# Patient Record
Sex: Male | Born: 2000 | Race: Black or African American | Hispanic: No | Marital: Single | Smoking: Never smoker
Health system: Southern US, Community
[De-identification: ages and names within clinical notes are randomized; demographics above are authoritative.]

---

## 2018-09-30 ENCOUNTER — Ambulatory Visit (HOSPITAL_COMMUNITY)
Admission: EM | Admit: 2018-09-30 | Discharge: 2018-09-30 | Disposition: A | Discharge status: Home / Self Care | Payer: 59 | Attending: Internal Medicine | Admitting: Internal Medicine

## 2018-09-30 ENCOUNTER — Encounter (HOSPITAL_COMMUNITY): Payer: Self-pay

## 2018-09-30 ENCOUNTER — Other Ambulatory Visit: Payer: Self-pay

## 2018-09-30 DIAGNOSIS — T148XXA Other injury of unspecified body region, initial encounter: Secondary | ICD-10-CM

## 2018-09-30 MED ORDER — NAPROXEN 375 MG PO TABS: 375.0000 mg | ORAL_TABLET | Freq: Two times a day (BID) | ORAL | 0 refills | Status: DC

## 2018-09-30 NOTE — ED Provider Notes (Signed)
[place]    CSN: [id number] Arrival date & time: 09/30/18  1152     History   Chief Complaint Chief Complaint  Patient presents with  . Leg Pain    HPI Jonathan Haynes is a 18 y.o. male no past medical history comes to urgent care with complaints of left thigh pain which started yesterday when he was playing basketball.  According to the patient he was playing basketball and noticed a pop in the left thigh area.  He subsequently developed pain which is progressively worse and is currently constant.  Is worsened by movement.  He has not tried any over-the-counter medications.  No weakness in the left leg.  No rash noted.Marland Kitchen   HPI  History reviewed. No pertinent past medical history.  There are no active problems to display for this patient.   History reviewed. No pertinent surgical history.     Home Medications    Prior to Admission medications   Medication Sig Start Date End Date Taking? Authorizing Provider  naproxen (NAPROSYN) 375 MG tablet Take 1 tablet (375 mg total) by mouth 2 (two) times daily. 09/30/18   LampteyMyrene Galas, MD    Family History Family History  Problem Relation Age of Onset  . Healthy Mother   . Healthy Father     Social History Social History   Tobacco Use  . Smoking status: Never Smoker  . Smokeless tobacco: Never Used  Substance Use Topics  . Alcohol use: Never    Frequency: Never  . Drug use: Never     Allergies   Patient has no known allergies.   Review of Systems Review of Systems  Constitutional: Positive for activity change. Negative for appetite change, chills and fever.  HENT: Negative.   Respiratory: Negative.   Cardiovascular: Negative.   Gastrointestinal: Negative.   Genitourinary: Negative.   Musculoskeletal: Positive for arthralgias, gait problem and myalgias. Negative for back pain, neck pain and neck stiffness.  Neurological: Negative for dizziness, weakness, numbness and headaches.      Physical Exam Triage Vital Signs ED Triage Vitals  Enc Vitals Group     BP 09/30/18 1234 124/64     Pulse Rate 09/30/18 1234 69     Resp 09/30/18 1234 16     Temp 09/30/18 1234 98.8 F (37.1 C)     Temp Source 09/30/18 1234 Oral     SpO2 09/30/18 1234 100 %     Weight 09/30/18 1236 140 lb (63.5 kg)     Height --      Head Circumference --      Peak Flow --      Pain Score --      Pain Loc --      Pain Edu? --      Excl. in [place]? --    No data found.  Updated Vital Signs BP 124/64 (BP Location: Right Arm)   Pulse 69   Temp 98.8 F (37.1 C) (Oral)   Resp 16   Wt 63.5 kg   SpO2 100%   Visual Acuity Right Eye Distance:   Left Eye Distance:   Bilateral Distance:    Right Eye Near:   Left Eye Near:    Bilateral Near:     Physical Exam Constitutional:      Appearance: Normal appearance.  Neck:     Musculoskeletal: Normal range of motion and neck supple.  Cardiovascular:     Rate and Rhythm: Normal rate and regular rhythm.  Pulmonary:     Effort: Pulmonary effort is normal.     Breath sounds: Normal breath sounds.  Musculoskeletal:        General: Swelling, tenderness and signs of injury present.  Skin:    General: Skin is warm.     Capillary Refill: Capillary refill takes less than 2 seconds.  Neurological:     General: No focal deficit present.     Mental Status: He is alert and oriented to person, place, and time.      UC Treatments / Results  Labs (all labs ordered are listed, but only abnormal results are displayed) Labs Reviewed - No data to display  EKG None  Radiology No results found.  Procedures Procedures (including critical care time)  Medications Ordered in UC Medications - No data to display  Initial Impression / Assessment and Plan / UC Course  I have reviewed the triage vital signs and the nursing notes.  Pertinent labs & imaging results that were available during my care of the patient were reviewed by me and considered in my  medical decision making (see chart for details).     1.  Possible contusion of the left thigh: Naproxen 375 mg twice daily as needed for pain Rest for the next couple days also Gentle stretches. Icing of the contused area intermittently Hopefully patient can return to his usual activities in the next 48 to 72 hours  Final Clinical Impressions(s) / UC Diagnoses   Final diagnoses:  Muscle contusion   Discharge Instructions   None    ED Prescriptions    Medication Sig Dispense Auth. Provider   naproxen (NAPROSYN) 375 MG tablet Take 1 tablet (375 mg total) by mouth 2 (two) times daily. 20 tablet Kadee Philyaw, Britta MccreedyPhilip O, MD     Controlled Substance Prescriptions [place] Controlled Substance Registry consulted? No   Merrilee JanskyLamptey, Juliocesar Blasius O, MD 09/30/18 [phone]

## 2018-09-30 NOTE — ED Triage Notes (Signed)
Pt states he was playing basketball and came down on his foot wrong and heard something pop in his left leg. This happened yesterday.

## 2020-03-18 ENCOUNTER — Encounter: Payer: Self-pay | Admitting: Emergency Medicine

## 2020-03-18 ENCOUNTER — Other Ambulatory Visit: Payer: Self-pay

## 2020-03-18 ENCOUNTER — Ambulatory Visit
Admission: EM | Admit: 2020-03-18 | Discharge: 2020-03-18 | Disposition: A | Discharge status: Home / Self Care | Payer: 59 | Attending: Emergency Medicine | Admitting: Emergency Medicine

## 2020-03-18 DIAGNOSIS — M545 Low back pain, unspecified: Secondary | ICD-10-CM | POA: Diagnosis not present

## 2020-03-18 MED ORDER — TIZANIDINE HCL 4 MG PO TABS: 4.0000 mg | ORAL_TABLET | Freq: Four times a day (QID) | ORAL | 0 refills | Status: AC | PRN

## 2020-03-18 MED ORDER — IBUPROFEN 800 MG PO TABS: 800.0000 mg | ORAL_TABLET | Freq: Three times a day (TID) | ORAL | 0 refills | Status: AC
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
## 2020-03-18 NOTE — Discharge Instructions (Signed)
Use anti-inflammatories for pain/swelling. You may take up to 800 mg Ibuprofen every 8 hours with food. You may supplement Ibuprofen with Tylenol 500-1000 mg every 8 hours.   You may use tizanidine as needed to help with pain. This is a muscle relaxer and causes sedation- please use only at bedtime or when you will be home and not have to drive/work  Alternate ice and heat Gentle stretching Follow-up if not improving or worsening 
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
## 2020-03-24 ENCOUNTER — Emergency Department (HOSPITAL_COMMUNITY): Payer: 59

## 2020-03-24 ENCOUNTER — Emergency Department (HOSPITAL_COMMUNITY)
Admission: EM | Admit: 2020-03-24 | Discharge: 2020-03-24 | Disposition: A | Discharge status: Home / Self Care | Payer: 59 | Attending: Emergency Medicine | Admitting: Emergency Medicine

## 2020-03-24 ENCOUNTER — Other Ambulatory Visit: Payer: Self-pay

## 2020-03-24 ENCOUNTER — Encounter (HOSPITAL_COMMUNITY): Payer: Self-pay

## 2020-03-24 DIAGNOSIS — S0990XA Unspecified injury of head, initial encounter: Secondary | ICD-10-CM | POA: Diagnosis not present

## 2020-03-24 DIAGNOSIS — Z23 Encounter for immunization: Secondary | ICD-10-CM | POA: Diagnosis not present

## 2020-03-24 DIAGNOSIS — S300XXA Contusion of lower back and pelvis, initial encounter: Secondary | ICD-10-CM | POA: Diagnosis not present

## 2020-03-24 DIAGNOSIS — S50311A Abrasion of right elbow, initial encounter: Secondary | ICD-10-CM

## 2020-03-24 DIAGNOSIS — F1590 Other stimulant use, unspecified, uncomplicated: Secondary | ICD-10-CM | POA: Insufficient documentation

## 2020-03-24 DIAGNOSIS — S3992XA Unspecified injury of lower back, initial encounter: Secondary | ICD-10-CM | POA: Diagnosis present

## 2020-03-24 DIAGNOSIS — S80211A Abrasion, right knee, initial encounter: Secondary | ICD-10-CM | POA: Diagnosis not present

## 2020-03-24 DIAGNOSIS — S20229A Contusion of unspecified back wall of thorax, initial encounter: Secondary | ICD-10-CM

## 2020-03-24 MED ORDER — BACITRACIN ZINC 500 UNIT/GM EX OINT
1.0000 "application " | TOPICAL_OINTMENT | Freq: Two times a day (BID) | CUTANEOUS | Status: DC
  Administered 2020-03-24: 1 via TOPICAL

## 2020-03-24 MED ORDER — TETANUS-DIPHTH-ACELL PERTUSSIS 5-2.5-18.5 LF-MCG/0.5 IM SUSY
0.5000 mL | PREFILLED_SYRINGE | Freq: Once | INTRAMUSCULAR | Status: AC
  Administered 2020-03-24: 18:00:00 0.5 mL via INTRAMUSCULAR
  Filled 2020-03-24: qty 0.5

## 2020-03-24 MED ORDER — IOHEXOL 300 MG/ML  SOLN
100.0000 mL | Freq: Once | INTRAMUSCULAR | Status: AC | PRN
  Administered 2020-03-24: 19:00:00 100 mL via INTRAVENOUS
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
## 2020-03-24 NOTE — ED Notes (Signed)
Patient verbalized understanding of discharge instructions. Opportunity for questions and answers.  
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
## 2020-03-25 ENCOUNTER — Encounter: Payer: Self-pay | Admitting: Emergency Medicine
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
